# Patient Record
Sex: Female | Born: 1955 | Race: White | Hispanic: No | Marital: Married | Smoking: Former smoker
Health system: Southern US, Community
[De-identification: ages and names within clinical notes are randomized; demographics above are authoritative.]

## PROBLEM LIST (undated history)

## (undated) DIAGNOSIS — K589 Irritable bowel syndrome without diarrhea: Secondary | ICD-10-CM

## (undated) DIAGNOSIS — G8929 Other chronic pain: Secondary | ICD-10-CM

## (undated) DIAGNOSIS — M549 Dorsalgia, unspecified: Secondary | ICD-10-CM

## (undated) DIAGNOSIS — F32A Depression, unspecified: Secondary | ICD-10-CM

## (undated) DIAGNOSIS — M1990 Unspecified osteoarthritis, unspecified site: Secondary | ICD-10-CM

## (undated) DIAGNOSIS — K219 Gastro-esophageal reflux disease without esophagitis: Secondary | ICD-10-CM

## (undated) DIAGNOSIS — L900 Lichen sclerosus et atrophicus: Secondary | ICD-10-CM

## (undated) DIAGNOSIS — F329 Major depressive disorder, single episode, unspecified: Secondary | ICD-10-CM

## (undated) DIAGNOSIS — F419 Anxiety disorder, unspecified: Secondary | ICD-10-CM

## (undated) HISTORY — DX: Irritable bowel syndrome, unspecified: K58.9

## (undated) HISTORY — DX: Unspecified osteoarthritis, unspecified site: M19.90

## (undated) HISTORY — PX: OOPHORECTOMY: SHX86

## (undated) HISTORY — PX: SP BILIARY STONE EXTRACTION: HXRAD329

## (undated) HISTORY — DX: Other chronic pain: G89.29

## (undated) HISTORY — DX: Dorsalgia, unspecified: M54.9

## (undated) HISTORY — DX: Gastro-esophageal reflux disease without esophagitis: K21.9

## (undated) HISTORY — DX: Lichen sclerosus et atrophicus: L90.0

## (undated) HISTORY — PX: ERCP: SHX60

---

## 1997-12-06 HISTORY — PX: BACK SURGERY: SHX140

## 1998-06-02 ENCOUNTER — Ambulatory Visit (HOSPITAL_COMMUNITY): Admission: RE | Admit: 1998-06-02 | Discharge: 1998-06-02 | Payer: Self-pay | Admitting: Neurosurgery

## 1998-07-03 ENCOUNTER — Inpatient Hospital Stay (HOSPITAL_COMMUNITY): Admission: RE | Admit: 1998-07-03 | Discharge: 1998-07-03 | Payer: Self-pay | Admitting: Neurosurgery

## 1998-08-04 ENCOUNTER — Ambulatory Visit (HOSPITAL_COMMUNITY): Admission: RE | Admit: 1998-08-04 | Discharge: 1998-08-04 | Payer: Self-pay | Admitting: Neurosurgery

## 1998-10-01 ENCOUNTER — Ambulatory Visit (HOSPITAL_COMMUNITY): Admission: RE | Admit: 1998-10-01 | Discharge: 1998-10-01 | Payer: Self-pay | Admitting: Neurosurgery

## 1998-10-01 ENCOUNTER — Encounter: Payer: Self-pay | Admitting: Neurosurgery

## 1999-12-07 HISTORY — PX: CHOLECYSTECTOMY: SHX55

## 2002-09-03 ENCOUNTER — Encounter: Payer: Self-pay | Admitting: Neurosurgery

## 2002-09-03 ENCOUNTER — Encounter: Admission: RE | Admit: 2002-09-03 | Discharge: 2002-09-03 | Payer: Self-pay | Admitting: Neurosurgery

## 2004-09-07 ENCOUNTER — Ambulatory Visit: Payer: Self-pay | Admitting: Pain Medicine

## 2004-09-21 ENCOUNTER — Ambulatory Visit: Payer: Self-pay | Admitting: Pain Medicine

## 2004-10-07 ENCOUNTER — Ambulatory Visit: Payer: Self-pay | Admitting: Pain Medicine

## 2004-11-05 ENCOUNTER — Ambulatory Visit: Payer: Self-pay | Admitting: Physician Assistant

## 2004-11-24 ENCOUNTER — Ambulatory Visit: Payer: Self-pay | Admitting: Physician Assistant

## 2004-12-08 ENCOUNTER — Ambulatory Visit: Payer: Self-pay | Admitting: Pain Medicine

## 2005-01-04 ENCOUNTER — Ambulatory Visit: Payer: Self-pay | Admitting: Pain Medicine

## 2005-01-20 ENCOUNTER — Ambulatory Visit: Payer: Self-pay | Admitting: Pain Medicine

## 2005-03-10 ENCOUNTER — Ambulatory Visit: Payer: Self-pay | Admitting: Pain Medicine

## 2005-04-05 ENCOUNTER — Ambulatory Visit: Payer: Self-pay | Admitting: Pain Medicine

## 2005-04-19 ENCOUNTER — Inpatient Hospital Stay: Payer: Self-pay | Admitting: Pain Medicine

## 2005-04-19 ENCOUNTER — Ambulatory Visit: Payer: Self-pay | Admitting: Pain Medicine

## 2005-05-05 ENCOUNTER — Ambulatory Visit: Payer: Self-pay | Admitting: Pain Medicine

## 2005-05-21 ENCOUNTER — Emergency Department: Payer: Self-pay | Admitting: Unknown Physician Specialty

## 2005-06-01 ENCOUNTER — Ambulatory Visit: Payer: Self-pay | Admitting: Physician Assistant

## 2005-06-25 ENCOUNTER — Ambulatory Visit: Payer: Self-pay | Admitting: Physician Assistant

## 2005-07-29 ENCOUNTER — Ambulatory Visit: Payer: Self-pay | Admitting: Physician Assistant

## 2005-08-25 ENCOUNTER — Ambulatory Visit: Payer: Self-pay | Admitting: Physician Assistant

## 2005-09-29 ENCOUNTER — Ambulatory Visit: Payer: Self-pay | Admitting: Physician Assistant

## 2005-10-18 ENCOUNTER — Ambulatory Visit: Payer: Self-pay | Admitting: Physician Assistant

## 2005-10-27 ENCOUNTER — Ambulatory Visit: Payer: Self-pay | Admitting: Physician Assistant

## 2005-11-10 ENCOUNTER — Ambulatory Visit: Payer: Self-pay | Admitting: Physician Assistant

## 2005-12-02 ENCOUNTER — Ambulatory Visit: Payer: Self-pay | Admitting: Internal Medicine

## 2005-12-06 HISTORY — PX: COLONOSCOPY: SHX174

## 2005-12-06 HISTORY — PX: UPPER GI ENDOSCOPY: SHX6162

## 2005-12-16 ENCOUNTER — Ambulatory Visit: Payer: Self-pay | Admitting: Physician Assistant

## 2006-01-26 ENCOUNTER — Ambulatory Visit: Payer: Self-pay | Admitting: Physician Assistant

## 2006-02-03 ENCOUNTER — Ambulatory Visit: Payer: Self-pay | Admitting: Physician Assistant

## 2006-02-17 ENCOUNTER — Ambulatory Visit: Payer: Self-pay | Admitting: Physician Assistant

## 2006-03-23 ENCOUNTER — Ambulatory Visit: Payer: Self-pay | Admitting: Physician Assistant

## 2006-04-26 ENCOUNTER — Ambulatory Visit: Payer: Self-pay | Admitting: Physician Assistant

## 2006-05-23 ENCOUNTER — Ambulatory Visit: Payer: Self-pay | Admitting: Physician Assistant

## 2006-05-27 ENCOUNTER — Ambulatory Visit: Payer: Self-pay | Admitting: Physician Assistant

## 2006-08-22 ENCOUNTER — Ambulatory Visit: Payer: Self-pay | Admitting: Pain Medicine

## 2006-09-26 ENCOUNTER — Ambulatory Visit: Payer: Self-pay | Admitting: Pain Medicine

## 2006-10-10 ENCOUNTER — Ambulatory Visit: Payer: Self-pay | Admitting: Pain Medicine

## 2006-10-19 ENCOUNTER — Ambulatory Visit: Payer: Self-pay | Admitting: Pain Medicine

## 2006-11-21 ENCOUNTER — Ambulatory Visit: Payer: Self-pay | Admitting: Pain Medicine

## 2006-12-05 ENCOUNTER — Ambulatory Visit: Payer: Self-pay | Admitting: Internal Medicine

## 2006-12-07 ENCOUNTER — Ambulatory Visit: Payer: Self-pay | Admitting: Pain Medicine

## 2006-12-07 ENCOUNTER — Other Ambulatory Visit: Payer: Self-pay

## 2006-12-20 ENCOUNTER — Encounter: Admission: RE | Admit: 2006-12-20 | Discharge: 2006-12-20 | Payer: Self-pay | Admitting: Neurosurgery

## 2006-12-29 ENCOUNTER — Ambulatory Visit: Payer: Self-pay | Admitting: Physician Assistant

## 2007-01-07 ENCOUNTER — Encounter: Admission: RE | Admit: 2007-01-07 | Discharge: 2007-01-07 | Payer: Self-pay | Admitting: Neurosurgery

## 2007-01-30 ENCOUNTER — Ambulatory Visit: Payer: Self-pay | Admitting: Pain Medicine

## 2007-02-06 ENCOUNTER — Ambulatory Visit: Payer: Self-pay | Admitting: Physician Assistant

## 2007-02-23 ENCOUNTER — Ambulatory Visit: Payer: Self-pay | Admitting: Physician Assistant

## 2007-03-07 ENCOUNTER — Ambulatory Visit: Payer: Self-pay | Admitting: Pain Medicine

## 2007-03-09 ENCOUNTER — Ambulatory Visit: Payer: Self-pay | Admitting: Pain Medicine

## 2007-03-16 ENCOUNTER — Ambulatory Visit: Payer: Self-pay | Admitting: Pain Medicine

## 2007-04-03 ENCOUNTER — Ambulatory Visit: Payer: Self-pay | Admitting: Pain Medicine

## 2007-04-17 ENCOUNTER — Ambulatory Visit: Payer: Self-pay | Admitting: Pain Medicine

## 2007-05-05 ENCOUNTER — Ambulatory Visit: Payer: Self-pay | Admitting: Pain Medicine

## 2007-05-10 ENCOUNTER — Ambulatory Visit: Payer: Self-pay | Admitting: Pain Medicine

## 2007-05-15 ENCOUNTER — Ambulatory Visit: Payer: Self-pay | Admitting: Pain Medicine

## 2007-05-18 ENCOUNTER — Ambulatory Visit: Payer: Self-pay | Admitting: Physician Assistant

## 2007-06-05 ENCOUNTER — Ambulatory Visit: Payer: Self-pay | Admitting: Pain Medicine

## 2007-06-28 ENCOUNTER — Ambulatory Visit: Payer: Self-pay | Admitting: Pain Medicine

## 2007-08-28 ENCOUNTER — Ambulatory Visit: Payer: Self-pay | Admitting: Pain Medicine

## 2007-09-25 ENCOUNTER — Ambulatory Visit: Payer: Self-pay | Admitting: Pain Medicine

## 2007-10-10 ENCOUNTER — Ambulatory Visit: Payer: Self-pay | Admitting: Pain Medicine

## 2007-10-19 ENCOUNTER — Ambulatory Visit: Payer: Self-pay | Admitting: Physician Assistant

## 2007-11-27 ENCOUNTER — Ambulatory Visit: Payer: Self-pay | Admitting: Physician Assistant

## 2007-12-18 ENCOUNTER — Ambulatory Visit: Payer: Self-pay | Admitting: Physician Assistant

## 2008-01-03 ENCOUNTER — Ambulatory Visit: Payer: Self-pay | Admitting: Pain Medicine

## 2008-02-16 ENCOUNTER — Ambulatory Visit: Payer: Self-pay | Admitting: Pain Medicine

## 2008-04-24 ENCOUNTER — Ambulatory Visit: Payer: Self-pay | Admitting: Internal Medicine

## 2008-04-24 ENCOUNTER — Ambulatory Visit: Payer: Self-pay | Admitting: Pain Medicine

## 2008-08-15 ENCOUNTER — Ambulatory Visit: Payer: Self-pay | Admitting: Physician Assistant

## 2008-08-20 ENCOUNTER — Ambulatory Visit: Payer: Self-pay | Admitting: Physician Assistant

## 2008-11-07 ENCOUNTER — Ambulatory Visit: Payer: Self-pay | Admitting: Physician Assistant

## 2008-11-26 ENCOUNTER — Ambulatory Visit: Payer: Self-pay | Admitting: Physician Assistant

## 2008-12-06 DIAGNOSIS — G8929 Other chronic pain: Secondary | ICD-10-CM

## 2008-12-06 HISTORY — DX: Other chronic pain: G89.29

## 2008-12-17 ENCOUNTER — Ambulatory Visit: Payer: Self-pay | Admitting: Physician Assistant

## 2009-01-22 ENCOUNTER — Ambulatory Visit: Payer: Self-pay | Admitting: Pain Medicine

## 2009-01-31 ENCOUNTER — Ambulatory Visit: Payer: Self-pay | Admitting: Pain Medicine

## 2009-03-11 ENCOUNTER — Ambulatory Visit: Payer: Self-pay | Admitting: Physician Assistant

## 2009-03-25 ENCOUNTER — Ambulatory Visit: Payer: Self-pay | Admitting: Pain Medicine

## 2009-04-09 ENCOUNTER — Ambulatory Visit: Payer: Self-pay | Admitting: Physician Assistant

## 2009-05-07 ENCOUNTER — Ambulatory Visit: Payer: Self-pay | Admitting: Physician Assistant

## 2009-07-10 ENCOUNTER — Ambulatory Visit: Payer: Self-pay | Admitting: Physician Assistant

## 2009-10-08 ENCOUNTER — Ambulatory Visit: Payer: Self-pay | Admitting: Physician Assistant

## 2009-10-13 ENCOUNTER — Ambulatory Visit: Payer: Self-pay | Admitting: Pain Medicine

## 2009-10-17 ENCOUNTER — Ambulatory Visit: Payer: Self-pay | Admitting: Pain Medicine

## 2009-10-27 ENCOUNTER — Ambulatory Visit: Payer: Self-pay | Admitting: Pain Medicine

## 2009-12-02 ENCOUNTER — Ambulatory Visit: Payer: Self-pay | Admitting: Specialist

## 2009-12-10 ENCOUNTER — Ambulatory Visit: Payer: Self-pay | Admitting: Specialist

## 2010-02-02 ENCOUNTER — Ambulatory Visit: Payer: Self-pay | Admitting: Pain Medicine

## 2010-05-25 ENCOUNTER — Ambulatory Visit: Payer: Self-pay | Admitting: Pain Medicine

## 2010-12-06 HISTORY — PX: BILIARY DRAINAGE: SHX1229

## 2010-12-06 HISTORY — PX: ABDOMINAL HYSTERECTOMY: SHX81

## 2012-12-06 HISTORY — PX: ULNAR NERVE REPAIR: SHX2594

## 2014-05-08 ENCOUNTER — Other Ambulatory Visit: Payer: Self-pay | Admitting: *Deleted

## 2014-05-08 DIAGNOSIS — G8929 Other chronic pain: Secondary | ICD-10-CM

## 2014-05-08 DIAGNOSIS — M546 Pain in thoracic spine: Principal | ICD-10-CM

## 2014-05-08 DIAGNOSIS — M5416 Radiculopathy, lumbar region: Secondary | ICD-10-CM

## 2014-05-19 ENCOUNTER — Other Ambulatory Visit: Payer: Self-pay

## 2014-05-29 ENCOUNTER — Ambulatory Visit
Admission: RE | Admit: 2014-05-29 | Discharge: 2014-05-29 | Disposition: A | Discharge status: Home / Self Care | Payer: Medicare Other | Source: Ambulatory Visit | Attending: *Deleted | Admitting: *Deleted

## 2014-05-29 DIAGNOSIS — G8929 Other chronic pain: Secondary | ICD-10-CM

## 2014-05-29 DIAGNOSIS — M5416 Radiculopathy, lumbar region: Principal | ICD-10-CM

## 2014-05-29 DIAGNOSIS — M546 Pain in thoracic spine: Principal | ICD-10-CM

## 2014-05-29 MED ORDER — GADOBENATE DIMEGLUMINE 529 MG/ML IV SOLN
18.0000 mL | Freq: Once | INTRAVENOUS | Status: AC | PRN
  Administered 2014-05-29: 18 mL via INTRAVENOUS

## 2015-04-02 ENCOUNTER — Other Ambulatory Visit: Payer: Self-pay | Admitting: Internal Medicine

## 2015-04-02 DIAGNOSIS — N644 Mastodynia: Secondary | ICD-10-CM

## 2015-04-10 ENCOUNTER — Ambulatory Visit
Admission: RE | Admit: 2015-04-10 | Discharge: 2015-04-10 | Disposition: A | Discharge status: Home / Self Care | Payer: Medicare Other | Source: Ambulatory Visit | Attending: Internal Medicine | Admitting: Internal Medicine

## 2015-04-10 DIAGNOSIS — N644 Mastodynia: Secondary | ICD-10-CM

## 2016-02-25 ENCOUNTER — Other Ambulatory Visit: Payer: Self-pay | Admitting: *Deleted

## 2016-02-25 MED ORDER — MONTELUKAST SODIUM 10 MG PO TABS: 10.0000 mg | ORAL_TABLET | Freq: Every day | ORAL | Status: DC

## 2016-04-22 ENCOUNTER — Other Ambulatory Visit: Payer: Self-pay | Admitting: *Deleted

## 2016-04-22 MED ORDER — MONTELUKAST SODIUM 10 MG PO TABS: 10.0000 mg | ORAL_TABLET | Freq: Every day | ORAL | Status: DC

## 2016-04-30 ENCOUNTER — Other Ambulatory Visit: Payer: Self-pay | Admitting: Internal Medicine

## 2016-04-30 DIAGNOSIS — Z1231 Encounter for screening mammogram for malignant neoplasm of breast: Secondary | ICD-10-CM

## 2016-05-13 ENCOUNTER — Ambulatory Visit: Payer: Medicare Other

## 2016-05-20 ENCOUNTER — Ambulatory Visit
Admission: RE | Admit: 2016-05-20 | Discharge: 2016-05-20 | Disposition: A | Discharge status: Home / Self Care | Payer: Medicare Other | Source: Ambulatory Visit | Attending: Internal Medicine | Admitting: Internal Medicine

## 2016-05-20 ENCOUNTER — Other Ambulatory Visit: Payer: Self-pay | Admitting: Internal Medicine

## 2016-05-20 DIAGNOSIS — Z1231 Encounter for screening mammogram for malignant neoplasm of breast: Secondary | ICD-10-CM | POA: Diagnosis not present

## 2016-06-05 IMAGING — MG MM DIAG BREAST TOMO BILATERAL
8 of 20 series · 8 of 40 positions shown · non-contrast
Comparison: 04/24/2008

CLINICAL DATA: Focal pain right breast, 9 o'clock region
periareolar. Due for annual examination.

EXAM:
DIGITAL DIAGNOSTIC BILATERAL MAMMOGRAM WITH 3D TOMOSYNTHESIS WITH
CAD
ULTRASOUND RIGHT BREAST

[L MLO (1 of 2)]
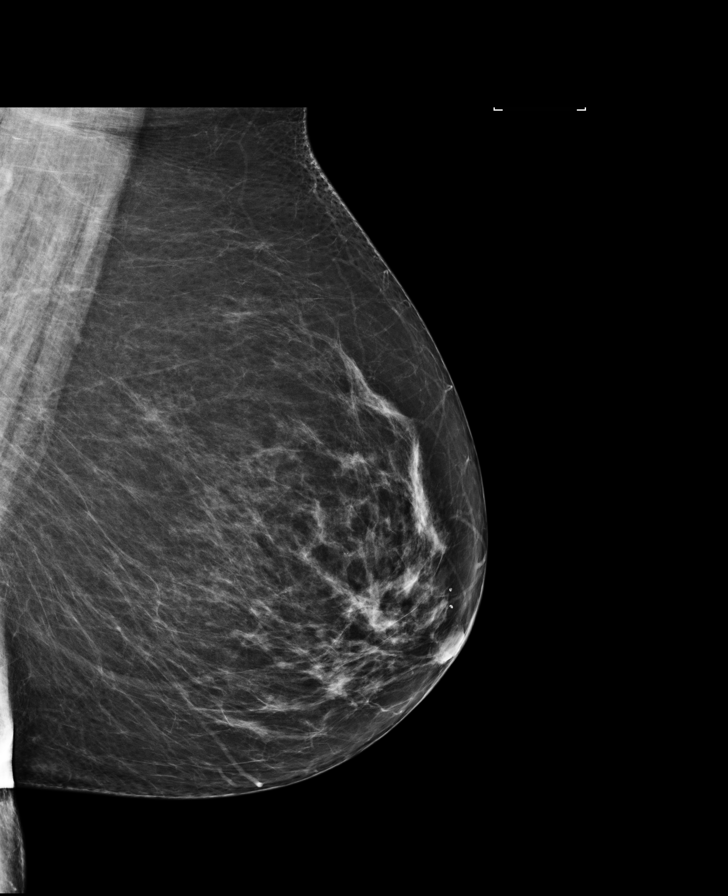

[L MLO synth-2D (1 of 2)]
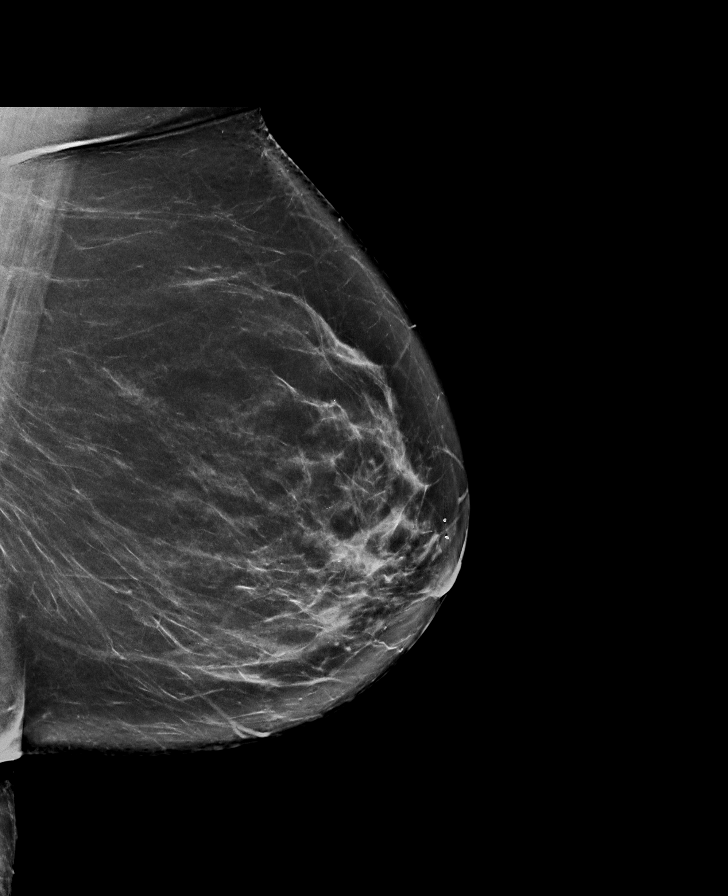

[L MLO synth-2D (2 of 2)]
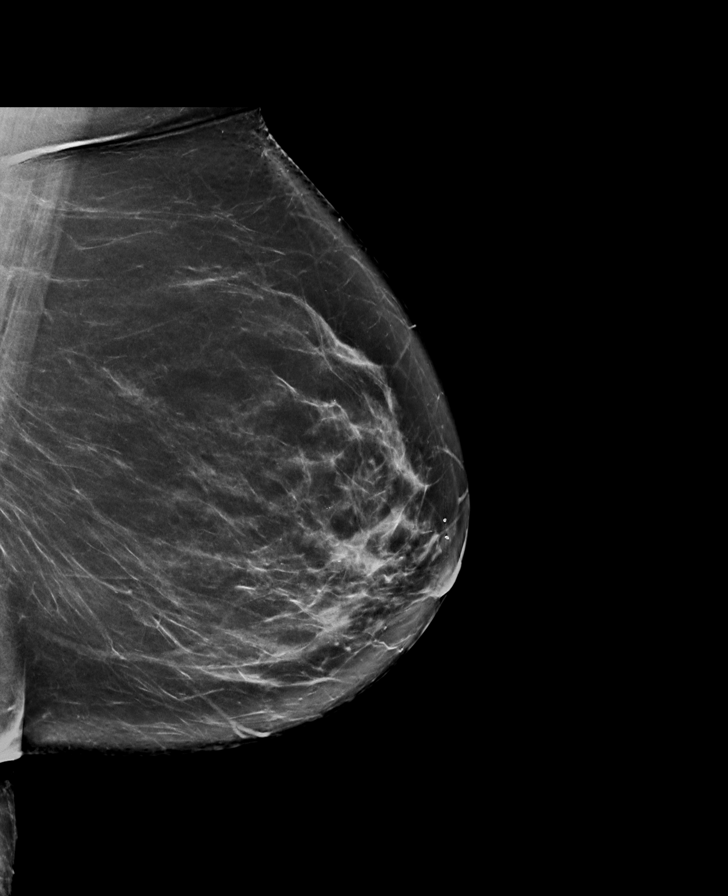

[R MLO synth-2D]
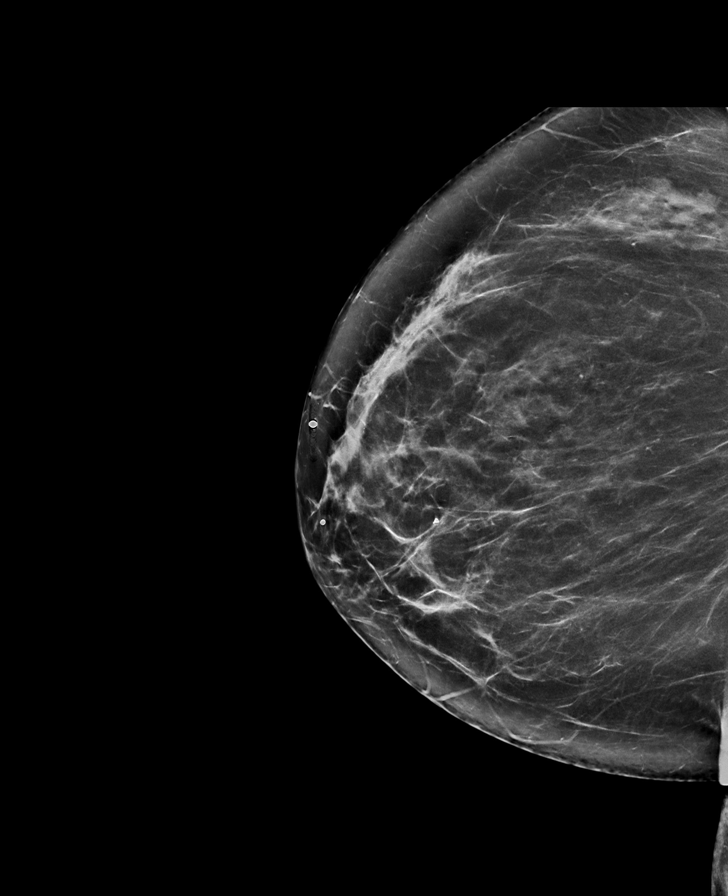

[L MLO (2 of 2)]
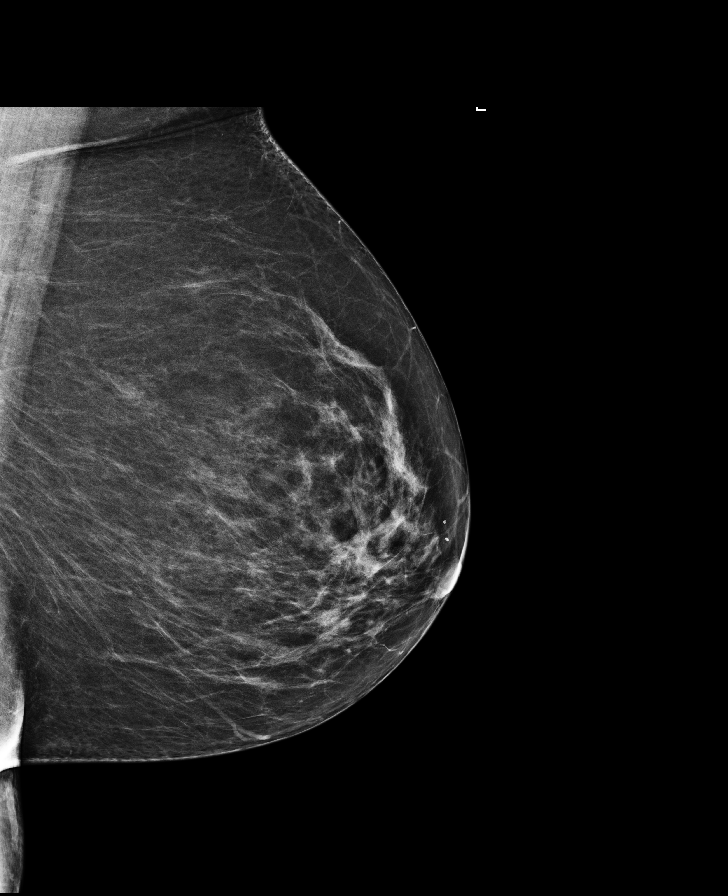

[L CC]
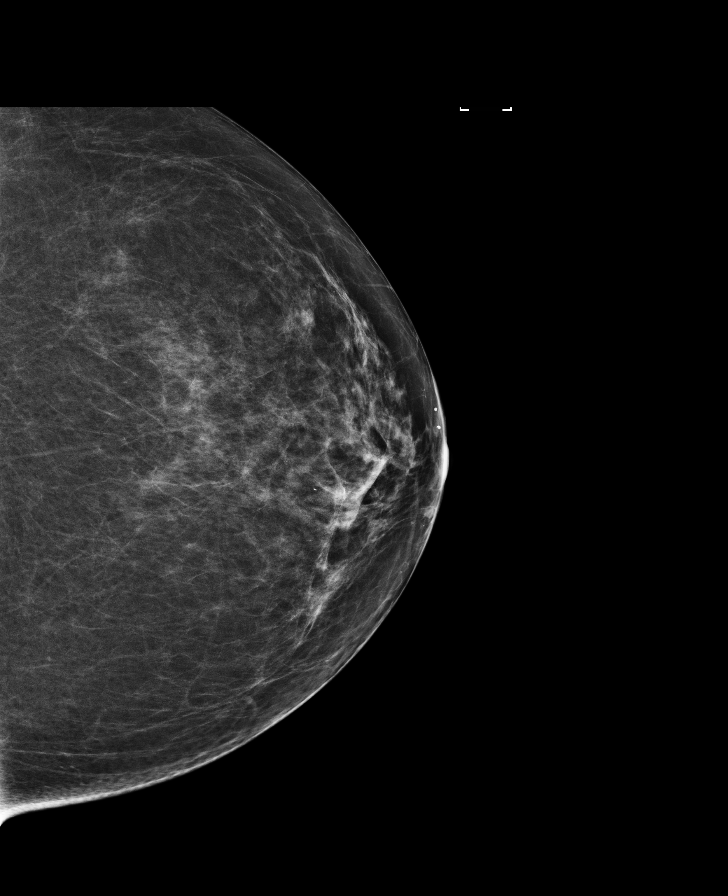

[R MLO (1 of 2)]
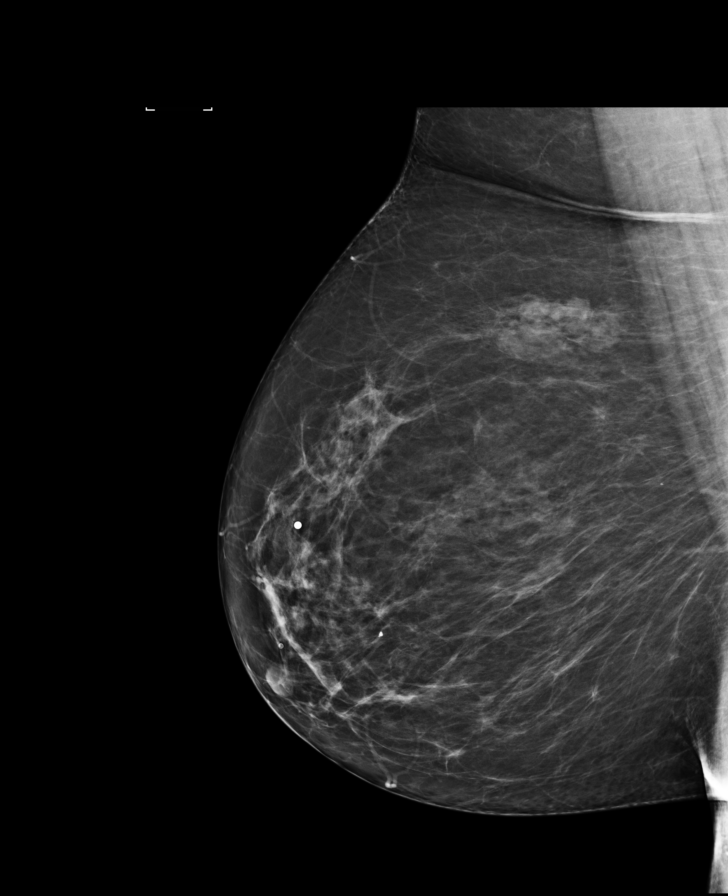

[R MLO (2 of 2)]
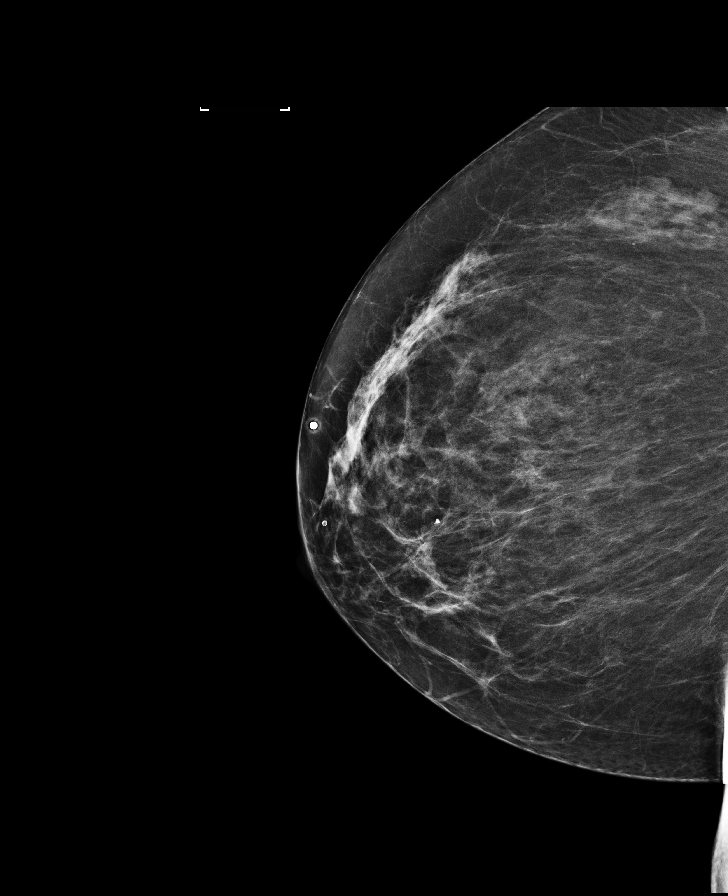

[8 of 40 positions shown; findings below may reference images not displayed]

ACR Breast Density Category b: There are scattered areas of
fibroglandular density.
FINDINGS: Metallic skin marker was placed at the site of focal pain. No mass,
distortion, or suspicious microcalcification is identified in either
breast to suggest malignancy. Spot tangential view of the region of
patient concern in the outer right breast is negative.

Mammographic images were processed with CAD.

On physical exam, I do not palpate a mass or thickening in in the
outer right breast.

Targeted ultrasound is performed, showing normal fibroglandular
breast tissue in the region of patient concern.
IMPRESSION: No evidence of malignancy in either breast.

RECOMMENDATION:
Screening mammogram in one year.(Code:[id number])

I have discussed the findings and recommendations with the patient.
Results were also provided in writing at the conclusion of the
visit. If applicable, a reminder letter will be sent to the patient
regarding the next appointment.

BI-RADS CATEGORY  1: Negative.

## 2016-12-06 DIAGNOSIS — L900 Lichen sclerosus et atrophicus: Secondary | ICD-10-CM

## 2016-12-06 HISTORY — DX: Lichen sclerosus et atrophicus: L90.0

## 2018-03-09 ENCOUNTER — Encounter: Payer: Self-pay | Admitting: *Deleted

## 2018-04-11 ENCOUNTER — Encounter: Payer: Self-pay | Admitting: General Surgery

## 2018-04-11 ENCOUNTER — Ambulatory Visit: Payer: Medicare Other | Admitting: General Surgery

## 2018-04-11 VITALS — BP 130/68 | HR 72 | Resp 14 | Ht 68.0 in | Wt 214.0 lb

## 2018-04-11 DIAGNOSIS — Z1211 Encounter for screening for malignant neoplasm of colon: Secondary | ICD-10-CM | POA: Diagnosis not present

## 2018-04-11 MED ORDER — POLYETHYLENE GLYCOL 3350 17 GM/SCOOP PO POWD: 1.0000 | Freq: Once | ORAL | 0 refills | Status: AC

## 2018-04-11 MED ORDER — METOCLOPRAMIDE HCL 10 MG PO TABS: 10.0000 mg | ORAL_TABLET | ORAL | 0 refills | Status: DC

## 2018-04-11 NOTE — Patient Instructions (Addendum)
Colonoscopy, Adult A colonoscopy is an exam to look at the entire large intestine. During the exam, a lubricated, bendable tube is inserted into the anus and then passed into the rectum, colon, and other parts of the large intestine. A colonoscopy is often done as a part of normal colorectal screening or in response to certain symptoms, such as anemia, persistent diarrhea, abdominal pain, and blood in the stool. The exam can help screen for and diagnose medical problems, including:  Tumors.  Polyps.  Inflammation.  Areas of bleeding.  Tell a health care provider about:  Any allergies you have.  All medicines you are taking, including vitamins, herbs, eye drops, creams, and over-the-counter medicines.  Any problems you or family members have had with anesthetic medicines.  Any blood disorders you have.  Any surgeries you have had.  Any medical conditions you have.  Any problems you have had passing stool. What are the risks? Generally, this is a safe procedure. However, problems may occur, including:  Bleeding.  A tear in the intestine.  A reaction to medicines given during the exam.  Infection (rare).  What happens before the procedure? Eating and drinking restrictions Follow instructions from your health care provider about eating and drinking, which may include:  A few days before the procedure - follow a low-fiber diet. Avoid nuts, seeds, dried fruit, raw fruits, and vegetables.  1-3 days before the procedure - follow a clear liquid diet. Drink only clear liquids, such as clear broth or bouillon, black coffee or tea, clear juice, clear soft drinks or sports drinks, gelatin dessert, and popsicles. Avoid any liquids that contain red or purple dye.  On the day of the procedure - do not eat or drink anything during the 2 hours before the procedure, or within the time period that your health care provider recommends.  Bowel prep If you were prescribed an oral bowel prep  to clean out your colon:  Take it as told by your health care provider. Starting the day before your procedure, you will need to drink a large amount of medicated liquid. The liquid will cause you to have multiple loose stools until your stool is almost clear or light green.  If your skin or anus gets irritated from diarrhea, you may use these to relieve the irritation: ? Medicated wipes, such as adult wet wipes with aloe and vitamin E. ? A skin soothing-product like petroleum jelly.  If you vomit while drinking the bowel prep, take a break for up to 60 minutes and then begin the bowel prep again. If vomiting continues and you cannot take the bowel prep without vomiting, call your health care provider.  General instructions  Ask your health care provider about changing or stopping your regular medicines. This is especially important if you are taking diabetes medicines or blood thinners.  Plan to have someone take you home from the hospital or clinic. What happens during the procedure?  An IV tube may be inserted into one of your veins.  You will be given medicine to help you relax (sedative).  To reduce your risk of infection: ? Your health care team will wash or sanitize their hands. ? Your anal area will be washed with soap.  You will be asked to lie on your side with your knees bent.  Your health care provider will lubricate a long, thin, flexible tube. The tube will have a camera and a light on the end.  The tube will be inserted into your   anus.  The tube will be gently eased through your rectum and colon.  Air will be delivered into your colon to keep it open. You may feel some pressure or cramping.  The camera will be used to take images during the procedure.  A small tissue sample may be removed from your body to be examined under a microscope (biopsy). If any potential problems are found, the tissue will be sent to a lab for testing.  If small polyps are found, your  health care provider may remove them and have them checked for cancer cells.  The tube that was inserted into your anus will be slowly removed. The procedure may vary among health care providers and hospitals. What happens after the procedure?  Your blood pressure, heart rate, breathing rate, and blood oxygen level will be monitored until the medicines you were given have worn off.  Do not drive for 24 hours after the exam.  You may have a small amount of blood in your stool.  You may pass gas and have mild abdominal cramping or bloating due to the air that was used to inflate your colon during the exam.  It is up to you to get the results of your procedure. Ask your health care provider, or the department performing the procedure, when your results will be ready. This information is not intended to replace advice given to you by your health care provider. Make sure you discuss any questions you have with your health care provider. Document Released: 11/19/2000 Document Revised: 09/22/2016 Document Reviewed: 02/03/2016 Elsevier Interactive Patient Education  [employer].  The patient is scheduled for a Colonoscopy at [facility] on 04/26/18. They are aware to call the day before to get their arrival time. She will only take her Metoprolol at 6 am with a sip of water the morning of and also will take her Reglan 30 minutes prior to procedure. Miralax prescription has been sent into the patient's pharmacy. The patient is aware of date and instructions.

## 2018-04-11 NOTE — Progress Notes (Signed)
Patient ID: Carol Orr, female   DOB: Jul 13, 1956, 63 y.o.   MRN: [id number]  Chief Complaint  Patient presents with  . Colonoscopy    HPI Carol Orr is a 62 y.o. female here today for a evaluation of a colonoscopy. Patient states her last colonoscopy was done in 2013. Patient states no GI problems at this time. Moves her bowels daily. She does state grease bothers her stomach.  HPI  Past Medical History:  Diagnosis Date  . Arthritis   . Chronic back pain 2010   with pain pump  . GERD (gastroesophageal reflux disease)   . IBS (irritable bowel syndrome)   . Lichen sclerosus 2018   treated with methotrexate     Past Surgical History:  Procedure Laterality Date  . ABDOMINAL HYSTERECTOMY  2012  . BACK SURGERY  1999  . BILIARY DRAINAGE  2012   [facility]  . CHOLECYSTECTOMY  2001  . COLONOSCOPY  2007  . ERCP    . SP BILIARY STONE EXTRACTION    . ULNAR NERVE REPAIR Left 2014   Dr Hyacinth Meeker  . UPPER GI ENDOSCOPY  2007    Family History  Problem Relation Age of Onset  . Breast cancer Mother   . Colon cancer Mother   . Ovarian cancer Sister   . Liver disease Father     Social History Social History   Tobacco Use  . Smoking status: Former Smoker    Types: Cigarettes    Last attempt to quit: 12/06/2013    Years since quitting: 4.3  . Smokeless tobacco: Never Used  Substance Use Topics  . Alcohol use: Never    Frequency: Never  . Drug use: Never    Allergies  Allergen Reactions  . Ciprofloxacin Rash  . Penicillins Rash  . Tape Rash    Current Outpatient Medications  Medication Sig Dispense Refill  . busPIRone (BUSPAR) 15 MG tablet TK 1/2 TO 1 T PO BID  5  . Cholecalciferol (VITAMIN D3) 2000 units capsule Take 2,000 Units by mouth daily.     . cyclobenzaprine (FLEXERIL) 5 MG tablet Take 5 mg by mouth 3 (three) times daily as needed.     Marland Kitchen estradiol (ESTRACE) 0.5 MG tablet Take 0.5 mg by mouth daily.     Marland Kitchen gabapentin (NEURONTIN) 800 MG tablet Take 800  mg by mouth 4 (four) times daily.     . metoprolol tartrate (LOPRESSOR) 25 MG tablet TAKE ONE TABLET BY MOUTH DAILY    . montelukast (SINGULAIR) 10 MG tablet Take 1 tablet (10 mg total) by mouth daily. 30 tablet 0  . oxyCODONE-acetaminophen (PERCOCET) 10-325 MG tablet Take 1 tablet by mouth every 6 (six) hours as needed.   0  . simvastatin (ZOCOR) 40 MG tablet Take 40 mg by mouth daily at 6 PM.     . traMADol (ULTRAM) 50 MG tablet TK 1 T PO Q 6 HOURS PRN FOR PAIN  2  . venlafaxine XR (EFFEXOR-XR) 150 MG 24 hr capsule Take 150 mg by mouth daily.     . clobetasol cream (TEMOVATE) 0.05 % Apply 1 application topically 2 (two) times daily.    . ISOtretinoin (CLARAVIS) 40 MG capsule TK 1 C PO QD WITH FOOD    . metoCLOPramide (REGLAN) 10 MG tablet Take 1 tablet (10 mg total) by mouth as directed for 2 doses. Take one tablet 30 minutes prior to prep may repeat in 4 hours if needed 2 tablet 0  . pimecrolimus (ELIDEL)  1 % cream Apply topically 2 (two) times daily.    Marland Kitchen triamcinolone cream (KENALOG) 0.1 % APPLY TO THE AFFECTED AREA 2-3 TIMES A DAY AS NEEDED     No current facility-administered medications for this visit.     Review of Systems Review of Systems  Constitutional: Negative.   Respiratory: Negative.   Cardiovascular: Negative.     Blood pressure 130/68, pulse 72, resp. rate 14, height  (1.727 m), weight 214 lb (97.1 kg).  Physical Exam Physical Exam  Constitutional: She is oriented to person, place, and time. She appears well-developed and well-nourished.  HENT:  Head: Normocephalic.  Eyes: No scleral icterus.  Neck: Normal range of motion. Neck supple.  Cardiovascular: Normal rate, regular rhythm and normal heart sounds.  Pulmonary/Chest: Effort normal and breath sounds normal.  Neurological: She is alert and oriented to person, place, and time.  Skin: Skin is warm and dry.  Psychiatric: Her behavior is normal.    Data Reviewed Laboratory studies dated January 26, 2018  showed a potassium of 5.1, otherwise normal electrolytes.  Creatinine 1.0 with an estimated GFR of 56.  Normal liver function studies.  Hemoglobin A1c 5.9.  Colonoscopy pathology of June 27, 2006 showed a hyperplastic polyp in the sigmoid.  Colonoscopy report described a 6 mm sessile polyp.  Upper endoscopy of the same date showed mild gastritis.  Biopsies were benign.  Assessment    Candidate for screening colonoscopy.    Plan    Reglan prior to colonoscopy prep Colonoscopy with possible biopsy/polypectomy prn: Information regarding the procedure, including its potential risks and complications (including but not limited to perforation of the bowel, which may require emergency surgery to repair, and bleeding) was verbally given to the patient. Educational information regarding lower intestinal endoscopy was given to the patient. Written instructions for how to complete the bowel prep using Miralax were provided. The importance of drinking ample fluids to avoid dehydration as a result of the prep emphasized.     HPI, Physical Exam, Assessment and Plan have been scribed under the direction and in the presence of Earline Mayotte, MD. Dorathy Daft, RN  The patient is scheduled for a Colonoscopy at [facility] on 04/26/18. They are aware to call the day before to get their arrival time. She will only take her Metoprolol at 6 am with a sip of water the morning of and also will take her Reglan 30 minutes prior to procedure. Miralax prescription has been sent into the patient's pharmacy. The patient is aware of date and instructions.  Merrily Pew Gaila Engebretsen 04/12/2018, 5:25 PM

## 2018-04-12 DIAGNOSIS — Z1211 Encounter for screening for malignant neoplasm of colon: Secondary | ICD-10-CM | POA: Insufficient documentation

## 2018-04-26 ENCOUNTER — Ambulatory Visit: Payer: Medicare Other | Admitting: Registered Nurse

## 2018-04-26 ENCOUNTER — Encounter
Admission: RE | Disposition: A | Discharge status: Home / Self Care | Payer: Self-pay | Source: Ambulatory Visit | Attending: General Surgery

## 2018-04-26 ENCOUNTER — Ambulatory Visit
Admission: RE | Admit: 2018-04-26 | Discharge: 2018-04-26 | Disposition: A | Discharge status: Home / Self Care | Payer: Medicare Other | Source: Ambulatory Visit | Attending: General Surgery | Admitting: General Surgery

## 2018-04-26 ENCOUNTER — Other Ambulatory Visit: Payer: Self-pay

## 2018-04-26 DIAGNOSIS — K219 Gastro-esophageal reflux disease without esophagitis: Secondary | ICD-10-CM | POA: Diagnosis not present

## 2018-04-26 DIAGNOSIS — Z1211 Encounter for screening for malignant neoplasm of colon: Secondary | ICD-10-CM

## 2018-04-26 DIAGNOSIS — F419 Anxiety disorder, unspecified: Secondary | ICD-10-CM | POA: Insufficient documentation

## 2018-04-26 DIAGNOSIS — Z800 Family history of malignant neoplasm of digestive organs: Secondary | ICD-10-CM | POA: Insufficient documentation

## 2018-04-26 DIAGNOSIS — Z79899 Other long term (current) drug therapy: Secondary | ICD-10-CM | POA: Diagnosis not present

## 2018-04-26 DIAGNOSIS — F329 Major depressive disorder, single episode, unspecified: Secondary | ICD-10-CM | POA: Diagnosis not present

## 2018-04-26 DIAGNOSIS — I10 Essential (primary) hypertension: Secondary | ICD-10-CM | POA: Insufficient documentation

## 2018-04-26 HISTORY — DX: Major depressive disorder, single episode, unspecified: F32.9

## 2018-04-26 HISTORY — PX: COLONOSCOPY WITH PROPOFOL: SHX5780

## 2018-04-26 HISTORY — DX: Depression, unspecified: F32.A

## 2018-04-26 HISTORY — DX: Anxiety disorder, unspecified: F41.9

## 2018-04-26 SURGERY — COLONOSCOPY WITH PROPOFOL
Anesthesia: General

## 2018-04-26 MED ORDER — LIDOCAINE HCL (CARDIAC) PF 100 MG/5ML IV SOSY
PREFILLED_SYRINGE | INTRAVENOUS | Status: DC | PRN
  Administered 2018-04-26: 50 mg via INTRAVENOUS

## 2018-04-26 MED ORDER — LIDOCAINE HCL (PF) 2 % IJ SOLN
INTRAMUSCULAR | Status: AC
  Filled 2018-04-26: qty 10

## 2018-04-26 MED ORDER — EPHEDRINE SULFATE 50 MG/ML IJ SOLN
INTRAMUSCULAR | Status: DC | PRN
  Administered 2018-04-26: 10 mg via INTRAVENOUS

## 2018-04-26 MED ORDER — PHENYLEPHRINE HCL 10 MG/ML IJ SOLN
INTRAMUSCULAR | Status: DC | PRN
  Administered 2018-04-26: 100 ug via INTRAVENOUS

## 2018-04-26 MED ORDER — MIDAZOLAM HCL 2 MG/2ML IJ SOLN
INTRAMUSCULAR | Status: DC | PRN
  Administered 2018-04-26: 2 mg via INTRAVENOUS

## 2018-04-26 MED ORDER — PROPOFOL 500 MG/50ML IV EMUL
INTRAVENOUS | Status: AC
  Filled 2018-04-26: qty 50

## 2018-04-26 MED ORDER — MIDAZOLAM HCL 2 MG/2ML IJ SOLN
INTRAMUSCULAR | Status: AC
  Filled 2018-04-26: qty 2

## 2018-04-26 MED ORDER — PROPOFOL 10 MG/ML IV BOLUS
INTRAVENOUS | Status: AC
  Filled 2018-04-26: qty 20

## 2018-04-26 MED ORDER — SODIUM CHLORIDE 0.9 % IV SOLN
INTRAVENOUS | Status: DC
  Administered 2018-04-26: 09:00:00 via INTRAVENOUS

## 2018-04-26 MED ORDER — PROPOFOL 500 MG/50ML IV EMUL
INTRAVENOUS | Status: DC | PRN
  Administered 2018-04-26: 175 ug/kg/min via INTRAVENOUS

## 2018-04-26 NOTE — H&P (Signed)
Carol Orr [id number] [birth year]     HPI:  Family history of colon cancer in her mother. Tolerated prep well. Reglan pre prep seems to have helped.   Medications Prior to Admission  Medication Sig Dispense Refill Last Dose  . busPIRone (BUSPAR) 15 MG tablet TK 1/2 TO 1 T PO BID  5 04/25/2018 at Unknown time  . Cholecalciferol (VITAMIN D3) 2000 units capsule Take 2,000 Units by mouth daily.    04/25/2018 at Unknown time  . clobetasol cream (TEMOVATE) 0.05 % Apply 1 application topically 2 (two) times daily.   Not Taking  . cyclobenzaprine (FLEXERIL) 5 MG tablet Take 5 mg by mouth 3 (three) times daily as needed.    04/25/2018 at Unknown time  . estradiol (ESTRACE) 0.5 MG tablet Take 0.5 mg by mouth daily.    04/25/2018 at Unknown time  . gabapentin (NEURONTIN) 800 MG tablet Take 800 mg by mouth 4 (four) times daily.    04/25/2018 at Unknown time  . metoCLOPramide (REGLAN) 10 MG tablet Take 1 tablet (10 mg total) by mouth as directed for 2 doses. Take one tablet 30 minutes prior to prep may repeat in 4 hours if needed 2 tablet 0 04/25/2018 at Unknown time  . metoprolol tartrate (LOPRESSOR) 25 MG tablet TAKE ONE TABLET BY MOUTH DAILY   04/26/2018 at Unknown time  . montelukast (SINGULAIR) 10 MG tablet Take 1 tablet (10 mg total) by mouth daily. 30 tablet 0 04/25/2018 at Unknown time  . oxyCODONE-acetaminophen (PERCOCET) 10-325 MG tablet Take 1 tablet by mouth every 6 (six) hours as needed.   0 Past Week at Unknown time  . simvastatin (ZOCOR) 40 MG tablet Take 40 mg by mouth daily at 6 PM.    04/25/2018 at Unknown time  . traMADol (ULTRAM) 50 MG tablet TK 1 T PO Q 6 HOURS PRN FOR PAIN  2 04/25/2018 at Unknown time  . venlafaxine XR (EFFEXOR-XR) 150 MG 24 hr capsule Take 150 mg by mouth daily.    04/25/2018 at Unknown time  . ISOtretinoin (CLARAVIS) 40 MG capsule TK 1 C PO QD WITH FOOD   Not Taking at Unknown time  . pimecrolimus (ELIDEL) 1 % cream Apply topically 2 (two) times daily.   Not Taking at  Unknown time  . triamcinolone cream (KENALOG) 0.1 % APPLY TO THE AFFECTED AREA 2-3 TIMES A DAY AS NEEDED   Not Taking at Unknown time   Allergies  Allergen Reactions  . Ciprofloxacin Rash  . Penicillins Rash  . Tape Rash    Plastic tape causes a rash. Paper tape & opsite ok to use   Past Medical History:  Diagnosis Date  . Anxiety   . Arthritis   . Chronic back pain 2010   with pain pump  . Depression   . GERD (gastroesophageal reflux disease)   . IBS (irritable bowel syndrome)   . Lichen sclerosus 2018   treated with methotrexate    Past Surgical History:  Procedure Laterality Date  . ABDOMINAL HYSTERECTOMY  2012  . BACK SURGERY  1999  . BILIARY DRAINAGE  2012   [facility]  . CHOLECYSTECTOMY  2001  . COLONOSCOPY  2007  . ERCP    . SP BILIARY STONE EXTRACTION    . ULNAR NERVE REPAIR Left 2014   Dr Hyacinth Meeker  . UPPER GI ENDOSCOPY  2007   Social History   Socioeconomic History  . Marital status: Married    Spouse name: Not on file  .  Number of children: Not on file  . Years of education: Not on file  . Highest education level: Not on file  Occupational History  . Not on file  Social Needs  . Financial resource strain: Not on file  . Food insecurity:    Worry: Not on file    Inability: Not on file  . Transportation needs:    Medical: Not on file    Non-medical: Not on file  Tobacco Use  . Smoking status: Former Smoker    Types: Cigarettes    Last attempt to quit: 12/06/2013    Years since quitting: 4.3  . Smokeless tobacco: Never Used  Substance and Sexual Activity  . Alcohol use: Never    Frequency: Never  . Drug use: Never  . Sexual activity: Not on file  Lifestyle  . Physical activity:    Days per week: Not on file    Minutes per session: Not on file  . Stress: Not on file  Relationships  . Social connections:    Talks on phone: Not on file    Gets together: Not on file    Attends religious service: Not on file    Active member of club or  organization: Not on file    Attends meetings of clubs or organizations: Not on file    Relationship status: Not on file  . Intimate partner violence:    Fear of current or ex partner: Not on file    Emotionally abused: Not on file    Physically abused: Not on file    Forced sexual activity: Not on file  Other Topics Concern  . Not on file  Social History Narrative  . Not on file   Social History   Social History Narrative  . Not on file     ROS: Negative.     PE: HEENT: Negative. Lungs: Clear. Cardio: RR.   Assessment/Plan:  Proceed with planned endoscopy.  Merrily Pew [facility] 04/26/2018

## 2018-04-26 NOTE — Anesthesia Preprocedure Evaluation (Signed)
Anesthesia Evaluation  Patient identified by MRN, date of birth, ID band Patient awake    Reviewed: Allergy & Precautions, H&P , NPO status , Patient's Chart, lab work & pertinent test results, reviewed documented beta blocker date and time   History of Anesthesia Complications Negative for: history of anesthetic complications  Airway Mallampati: I  TM Distance: >3 FB Neck ROM: full    Dental  (+) Upper Dentures, Dental Advidsory Given, Missing, Poor Dentition   Pulmonary neg pulmonary ROS, former smoker,           Cardiovascular Exercise Tolerance: Good hypertension, (-) angina(-) CAD, (-) Past MI, (-) Cardiac Stents and (-) CABG (-) dysrhythmias (-) Valvular Problems/Murmurs     Neuro/Psych PSYCHIATRIC DISORDERS Anxiety Depression negative neurological ROS     GI/Hepatic Neg liver ROS, GERD  ,  Endo/Other  negative endocrine ROS  Renal/GU negative Renal ROS  negative genitourinary   Musculoskeletal   Abdominal   Peds  Hematology negative hematology ROS (+)   Anesthesia Other Findings Past Medical History: No date: Anxiety No date: Arthritis 2010: Chronic back pain     Comment:  with pain pump No date: Depression No date: GERD (gastroesophageal reflux disease) No date: IBS (irritable bowel syndrome) 2018: Lichen sclerosus     Comment:  treated with methotrexate    Reproductive/Obstetrics negative OB ROS                             Anesthesia Physical Anesthesia Plan  ASA: II  Anesthesia Plan: General   Post-op Pain Management:    Induction: Intravenous  PONV Risk Score and Plan: 3 and Propofol infusion  Airway Management Planned: Nasal Cannula  Additional Equipment:   Intra-op Plan:   Post-operative Plan:   Informed Consent: I have reviewed the patients History and Physical, chart, labs and discussed the procedure including the risks, benefits and alternatives for  the proposed anesthesia with the patient or authorized representative who has indicated his/her understanding and acceptance.   Dental Advisory Given  Plan Discussed with: Anesthesiologist, CRNA and Surgeon  Anesthesia Plan Comments:         Anesthesia Quick Evaluation

## 2018-04-26 NOTE — Op Note (Signed)
[facility] Patient Name: Carol Orr Procedure Date: 04/26/2018 10:15 AM MRN: [id number] Account #: [id number] Date of Birth: 10/13/1956 Admit Type: Outpatient Age: 62 Room: [facility] ENDO ROOM 1 Gender: Female Note Status: Finalized Procedure:            Colonoscopy Indications:          Family history of colon cancer in a first-degree                        relative Providers:            Earline Mayotte, MD Referring MD:         Jillene Bucks. Arlana Pouch, MD (Referring MD) Medicines:            Monitored Anesthesia Care Complications:        No immediate complications. Procedure:            Pre-Anesthesia Assessment:                       - Prior to the procedure, a History and Physical was                        performed, and patient medications, allergies and                        sensitivities were reviewed. The patient's tolerance of                        previous anesthesia was reviewed.                       - The risks and benefits of the procedure and the                        sedation options and risks were discussed with the                        patient. All questions were answered and informed                        consent was obtained.                       After obtaining informed consent, the colonoscope was                        passed under direct vision. Throughout the procedure,                        the patient's blood pressure, pulse, and oxygen                        saturations were monitored continuously. The                        Colonoscope was introduced through the anus and                        advanced to the the cecum, identified by appendiceal  orifice and ileocecal valve. The colonoscopy was                        somewhat difficult due to significant looping.                        Successful completion of the procedure was aided by                        using manual pressure. The patient  tolerated the                        procedure well. The quality of the bowel preparation                        was good.                       It was not possible to get a clear photo of the                        appendiceal orifice as the photo option on the scope                        malfuntioned. Findings:      The entire examined colon appeared normal on direct and retroflexion       views. Impression:           - The entire examined colon is normal on direct and                        retroflexion views.                       - No specimens collected. Recommendation:       - Repeat colonoscopy in 5 years for surveillance. Procedure Code(s):    --- Professional ---                       [phone], Colonoscopy, flexible; diagnostic, including                        collection of specimen(s) by brushing or washing, when                        performed (separate procedure) Diagnosis Code(s):    --- Professional ---                       Z80.0, Family history of malignant neoplasm of                        digestive organs CPT copyright 2017 American Medical Association. All rights reserved. The codes documented in this report are preliminary and upon coder review may  be revised to meet current compliance requirements. Earline Mayotte, MD 04/26/2018 10:58:33 AM This report has been signed electronically. Number of Addenda: 0 Note Initiated On: 04/26/2018 10:15 AM Scope Withdrawal Time: 0 hours 8 minutes 26 seconds  Total Procedure Duration: 0 hours 30 minutes 4 seconds       [facility]

## 2018-04-26 NOTE — Anesthesia Procedure Notes (Signed)
Date/Time: 04/26/2018 10:16 AM Performed by: Stormy Fabian, CRNA Pre-anesthesia Checklist: Patient identified, Emergency Drugs available, Suction available and Patient being monitored Patient Re-evaluated:Patient Re-evaluated prior to induction Oxygen Delivery Method: Nasal cannula Induction Type: IV induction Dental Injury: Teeth and Oropharynx as per pre-operative assessment  Comments: Nasal cannula with etCO2 monitoring

## 2018-04-26 NOTE — Transfer of Care (Signed)
Immediate Anesthesia Transfer of Care Note  Patient: Carol Orr  Procedure(s) Performed: Procedure(s): COLONOSCOPY WITH PROPOFOL (N/A)  Patient Location: PACU and Endoscopy Unit  Anesthesia Type:General  Level of Consciousness: sedated  Airway & Oxygen Therapy: Patient Spontanous Breathing and Patient connected to nasal cannula oxygen  Post-op Assessment: Report given to RN and Post -op Vital signs reviewed and stable  Post vital signs: Reviewed and stable  Last Vitals:  Vitals:   04/26/18 0912 04/26/18 1100  BP: 136/68 (!) 93/47  Pulse: 67 67  Resp: 16 16  Temp: 36.5 C (!) 36.1 C  SpO2: 96% 100%    Complications: No apparent anesthesia complications

## 2018-04-26 NOTE — Anesthesia Post-op Follow-up Note (Signed)
Anesthesia QCDR form completed.        

## 2018-04-27 NOTE — Anesthesia Postprocedure Evaluation (Signed)
Anesthesia Post Note  Patient: Carol Orr  Procedure(s) Performed: COLONOSCOPY WITH PROPOFOL (N/A )  Patient location during evaluation: Endoscopy Anesthesia Type: General Level of consciousness: awake and alert Pain management: pain level controlled Vital Signs Assessment: post-procedure vital signs reviewed and stable Respiratory status: spontaneous breathing, nonlabored ventilation, respiratory function stable and patient connected to nasal cannula oxygen Cardiovascular status: blood pressure returned to baseline and stable Postop Assessment: no apparent nausea or vomiting Anesthetic complications: no     Last Vitals:  Vitals:   04/26/18 1110 04/26/18 1118  BP: 102/69 105/68  Pulse: 63 71  Resp: 16 16  Temp:    SpO2: 100% 99%    Last Pain:  Vitals:   04/27/18 0727  TempSrc:   PainSc: 0-No pain                 Lenard Simmer

## 2018-04-28 ENCOUNTER — Encounter: Payer: Self-pay | Admitting: General Surgery

## 2018-07-19 ENCOUNTER — Other Ambulatory Visit: Payer: Self-pay | Admitting: Internal Medicine

## 2018-07-19 DIAGNOSIS — Z1231 Encounter for screening mammogram for malignant neoplasm of breast: Secondary | ICD-10-CM

## 2018-08-22 ENCOUNTER — Ambulatory Visit
Admission: RE | Admit: 2018-08-22 | Discharge: 2018-08-22 | Disposition: A | Discharge status: Home / Self Care | Payer: Medicare Other | Source: Ambulatory Visit | Attending: Internal Medicine | Admitting: Internal Medicine

## 2018-08-22 DIAGNOSIS — Z1231 Encounter for screening mammogram for malignant neoplasm of breast: Secondary | ICD-10-CM | POA: Diagnosis not present

## 2021-03-04 ENCOUNTER — Other Ambulatory Visit: Payer: Self-pay | Admitting: Internal Medicine

## 2021-03-04 DIAGNOSIS — Z1231 Encounter for screening mammogram for malignant neoplasm of breast: Secondary | ICD-10-CM

## 2021-03-04 DIAGNOSIS — E2839 Other primary ovarian failure: Secondary | ICD-10-CM

## 2021-03-26 ENCOUNTER — Other Ambulatory Visit: Payer: Self-pay

## 2021-03-26 ENCOUNTER — Ambulatory Visit
Admission: RE | Admit: 2021-03-26 | Discharge: 2021-03-26 | Disposition: A | Discharge status: Home / Self Care | Payer: Medicare Other | Source: Ambulatory Visit | Attending: Internal Medicine | Admitting: Internal Medicine

## 2021-03-26 DIAGNOSIS — Z1231 Encounter for screening mammogram for malignant neoplasm of breast: Secondary | ICD-10-CM | POA: Insufficient documentation

## 2021-03-26 DIAGNOSIS — E2839 Other primary ovarian failure: Secondary | ICD-10-CM | POA: Diagnosis not present

## 2023-02-02 ENCOUNTER — Other Ambulatory Visit: Payer: Self-pay | Admitting: Internal Medicine

## 2023-02-02 DIAGNOSIS — Z1231 Encounter for screening mammogram for malignant neoplasm of breast: Secondary | ICD-10-CM

## 2023-02-23 ENCOUNTER — Ambulatory Visit
Admission: RE | Admit: 2023-02-23 | Discharge: 2023-02-23 | Disposition: A | Discharge status: Home / Self Care | Payer: Medicare Other | Source: Ambulatory Visit | Attending: Internal Medicine | Admitting: Internal Medicine

## 2023-02-23 DIAGNOSIS — Z1231 Encounter for screening mammogram for malignant neoplasm of breast: Secondary | ICD-10-CM | POA: Diagnosis not present

## 2023-11-29 ENCOUNTER — Ambulatory Visit (INDEPENDENT_AMBULATORY_CARE_PROVIDER_SITE_OTHER): Payer: Medicare Other | Admitting: Vascular Surgery

## 2023-11-29 VITALS — BP 132/79 | HR 72 | Resp 16 | Wt 206.8 lb

## 2023-11-29 DIAGNOSIS — I10 Essential (primary) hypertension: Secondary | ICD-10-CM

## 2023-11-29 DIAGNOSIS — I714 Abdominal aortic aneurysm, without rupture, unspecified: Secondary | ICD-10-CM | POA: Insufficient documentation

## 2023-11-29 DIAGNOSIS — E785 Hyperlipidemia, unspecified: Secondary | ICD-10-CM | POA: Diagnosis not present

## 2023-11-29 DIAGNOSIS — I7143 Infrarenal abdominal aortic aneurysm, without rupture: Secondary | ICD-10-CM | POA: Diagnosis not present

## 2023-11-29 DIAGNOSIS — E119 Type 2 diabetes mellitus without complications: Secondary | ICD-10-CM

## 2023-11-29 NOTE — Assessment & Plan Note (Signed)
lipid control important in reducing the progression of atherosclerotic disease. Continue statin therapy  

## 2023-11-29 NOTE — Progress Notes (Signed)
Patient ID: Carol Orr, female   DOB: [birth year], 67 y.o.   MRN: [id number]  Chief Complaint  Patient presents with   New Patient (Initial Visit)    Ref Arlana Pouch consult 3.55 cm AAA    HPI Carol Orr is a 67 y.o. female.  I am asked to see the patient by Dr. Arlana Pouch for evaluation of an AAA that was incidentally found on an MRI of her spine at an outside institution a couple of months ago.  This was reported as 3.55 cm. I can not see the images for review.  She is chronic low back pain and has a spine pump in place.  No new back pain, abdominal pain, or signs of peripheral embolization..     Past Medical History:  Diagnosis Date   Anxiety    Arthritis    Chronic back pain 2010   with pain pump   Depression    GERD (gastroesophageal reflux disease)    IBS (irritable bowel syndrome)    Lichen sclerosus 2018   treated with methotrexate     Past Surgical History:  Procedure Laterality Date   ABDOMINAL HYSTERECTOMY  2012   BACK SURGERY  1999   BILIARY DRAINAGE  2012   [facility]   CHOLECYSTECTOMY  2001   COLONOSCOPY  2007   COLONOSCOPY WITH PROPOFOL N/A 04/26/2018   Procedure: COLONOSCOPY WITH PROPOFOL;  Surgeon: Earline Mayotte, MD;  Location: [facility] ENDOSCOPY;  Service: Endoscopy;  Laterality: N/A;   ERCP     OOPHORECTOMY     SP BILIARY STONE EXTRACTION     ULNAR NERVE REPAIR Left 2014   Dr Hyacinth Meeker   UPPER GI ENDOSCOPY  2007     Family History  Problem Relation Age of Onset   Breast cancer Mother        >50   Colon cancer Mother    Ovarian cancer Sister    Breast cancer Sister        >50   Liver disease Father      Social History   Tobacco Use   Smoking status: Former    Current packs/day: 0.00    Types: Cigarettes    Quit date: 12/06/2013    Years since quitting: 9.9   Smokeless tobacco: Never  Substance Use Topics   Alcohol use: Never   Drug use: Never     Allergies  Allergen Reactions   Ciprofloxacin Rash   Penicillins Rash   Tape  Rash    Plastic tape causes a rash. Paper tape & opsite ok to use    Current Outpatient Medications  Medication Sig Dispense Refill   busPIRone (BUSPAR) 15 MG tablet TK 1/2 TO 1 T PO BID  5   Cholecalciferol (VITAMIN D3) 2000 units capsule Take 2,000 Units by mouth daily.      cyclobenzaprine (FLEXERIL) 5 MG tablet Take 5 mg by mouth 3 (three) times daily as needed.      empagliflozin (JARDIANCE) 10 MG TABS tablet Take 10 mg by mouth daily.     gabapentin (NEURONTIN) 800 MG tablet Take 800 mg by mouth 4 (four) times daily.      loratadine (CLARITIN) 10 MG tablet Take 10 mg by mouth daily.     metFORMIN (GLUCOPHAGE) 500 MG tablet Take 500 mg by mouth 2 (two) times daily with a meal.     metoprolol tartrate (LOPRESSOR) 25 MG tablet TAKE ONE TABLET BY MOUTH DAILY     montelukast (SINGULAIR) 10 MG  tablet Take 1 tablet (10 mg total) by mouth daily. 30 tablet 0   omeprazole (PRILOSEC) 20 MG capsule Take 20 mg by mouth 2 (two) times daily before a meal.     oxyCODONE-acetaminophen (PERCOCET) 10-325 MG tablet Take 1 tablet by mouth every 6 (six) hours as needed.   0   simvastatin (ZOCOR) 40 MG tablet Take 40 mg by mouth daily at 6 PM.      traMADol (ULTRAM) 50 MG tablet TK 1 T PO Q 6 HOURS PRN FOR PAIN  2   triamcinolone cream (KENALOG) 0.1 % APPLY TO THE AFFECTED AREA 2-3 TIMES A DAY AS NEEDED     UNABLE TO FIND Pain pump implant     venlafaxine XR (EFFEXOR-XR) 150 MG 24 hr capsule Take 150 mg by mouth daily.      clobetasol cream (TEMOVATE) 0.05 % Apply 1 application topically 2 (two) times daily. (Patient not taking: Reported on 11/29/2023)     estradiol (ESTRACE) 0.5 MG tablet Take 0.5 mg by mouth daily.  (Patient not taking: Reported on 11/29/2023)     ISOtretinoin (CLARAVIS) 40 MG capsule TK 1 C PO QD WITH FOOD (Patient not taking: Reported on 11/29/2023)     metoCLOPramide (REGLAN) 10 MG tablet Take 1 tablet (10 mg total) by mouth as directed for 2 doses. Take one tablet 30 minutes prior to  prep may repeat in 4 hours if needed (Patient not taking: Reported on 11/29/2023) 2 tablet 0   pimecrolimus (ELIDEL) 1 % cream Apply topically 2 (two) times daily. (Patient not taking: Reported on 11/29/2023)     No current facility-administered medications for this visit.      REVIEW OF SYSTEMS (Negative unless checked)  Constitutional: [] Weight loss  [] Fever  [] Chills Cardiac: [] Chest pain   [] Chest pressure   [] Palpitations   [] Shortness of breath when laying flat   [] Shortness of breath at rest   [] Shortness of breath with exertion. Vascular:  [] Pain in legs with walking   [] Pain in legs at rest   [] Pain in legs when laying flat   [] Claudication   [] Pain in feet when walking  [] Pain in feet at rest  [] Pain in feet when laying flat   [] History of DVT   [] Phlebitis   [] Swelling in legs   [] Varicose veins   [] Non-healing ulcers Pulmonary:   [] Uses home oxygen   [] Productive cough   [] Hemoptysis   [] Wheeze  [] COPD   [] Asthma Neurologic:  [] Dizziness  [] Blackouts   [] Seizures   [] History of stroke   [] History of TIA  [] Aphasia   [] Temporary blindness   [] Dysphagia   [] Weakness or numbness in arms   [] Weakness or numbness in legs Musculoskeletal:  [x] Arthritis   [] Joint swelling   [] Joint pain   [x] Low back pain Hematologic:  [] Easy bruising  [] Easy bleeding   [] Hypercoagulable state   [] Anemic  [] Hepatitis Gastrointestinal:  [] Blood in stool   [] Vomiting blood  [x] Gastroesophageal reflux/heartburn   [] Abdominal pain Genitourinary:  [] Chronic kidney disease   [] Difficult urination  [] Frequent urination  [] Burning with urination   [] Hematuria Skin:  [] Rashes   [] Ulcers   [] Wounds Psychological:  [x] History of anxiety   [x]  History of major depression.    Physical Exam BP 132/79   Pulse 72   Resp 16   Wt 206 lb 12.8 oz (93.8 kg)   BMI 32.39 kg/m  Gen:  WD/WN, NAD Head: [place]/AT, No temporalis wasting.  Ear/Nose/Throat: Hearing grossly intact, nares w/o erythema or drainage,  oropharynx w/o  Erythema/Exudate Eyes: Conjunctiva clear, sclera non-icteric  Neck: trachea midline.  No JVD.  Pulmonary:  Good air movement, respirations not labored, no use of accessory muscles  Cardiac: RRR, no JVD Vascular:  Vessel Right Left  Radial Palpable Palpable                                   Gastrointestinal:. No masses, surgical incisions, or scars. Aorta not palpable  Musculoskeletal: M/S 5/5 throughout.  Extremities without ischemic changes.  No deformity or atrophy. No edema. Neurologic: Sensation grossly intact in extremities.  Symmetrical.  Speech is fluent. Motor exam as listed above. Psychiatric: Judgment intact, Mood & affect appropriate for pt's clinical situation. Dermatologic: No rashes or ulcers noted.  No cellulitis or open wounds.    Radiology No results found.  Labs No results found for this or any previous visit (from the past 2160 hours).  Assessment/Plan:  AAA (abdominal aortic aneurysm) without rupture [facility]) The patient had an MRI of her spine which suggested a 3.55 cm distal aortic aneurysm.  Unfortunately, MRI is very inaccurate at measurement of the aneurysm and is difficult to discern what this means.  This was done at an outside institution about 3 months ago and I am unable to view the images only the report.  I have ordered a duplex of her abdominal aorta to be done in the near future at her convenience.  Essential hypertension blood pressure control important in reducing the progression of atherosclerotic disease and aneurysmal growth. On appropriate oral medications.   Diabetes (HCC) blood glucose control important in reducing the progression of atherosclerotic disease. Also, involved in wound healing. On appropriate medications.   Hyperlipidemia lipid control important in reducing the progression of atherosclerotic disease. Continue statin therapy      Festus Barren 11/29/2023, 11:31 AM   This note was created with Dragon medical  transcription system.  Any errors from dictation are unintentional.

## 2023-11-29 NOTE — Assessment & Plan Note (Signed)
blood pressure control important in reducing the progression of atherosclerotic disease and aneurysmal growth. On appropriate oral medications.  

## 2023-11-29 NOTE — Assessment & Plan Note (Signed)
The patient had an MRI of her spine which suggested a 3.55 cm distal aortic aneurysm.  Unfortunately, MRI is very inaccurate at measurement of the aneurysm and is difficult to discern what this means.  This was done at an outside institution about 3 months ago and I am unable to view the images only the report.  I have ordered a duplex of her abdominal aorta to be done in the near future at her convenience.

## 2023-11-29 NOTE — Patient Instructions (Signed)
Abdominal Aortic Aneurysm  An aneurysm is a bulge in an artery. An artery is a blood vessel that carries blood away from your heart. An abdominal aortic aneurysm happens in the main artery (aorta). Most aneurysms do not cause symptoms. But some can cause problems if they burst or tear. This can cause you to bleed inside your body. It is an emergency. It should be treated right away. What are the causes? The exact cause of an aneurysm is not known. What increases the risk? Being female and 60 years of age or older. Being Caucasian. Smoking. Being very overweight. Having: Family members who have had this condition. An injury to your main artery. Hardened arteries. Irritation and swelling of the walls of an artery. Certain genetic problems. An infection in the wall of the main artery. High cholesterol. High blood pressure. What are the signs or symptoms? You may not have any symptoms. If you do, you may have: Very bad pain in your side, back, or belly (abdomen). A feeling of fullness after you eat only a little bit of food. A lump in your belly that throbs. Problems with your feet or toes, like: Pain. Skin turning blue. Sores. Trouble pooping (constipation). Trouble peeing (urinating). If your aneurysm bursts, you may: Feel sudden, very bad pain in your belly, side, or back. Vomit or feel like you may vomit. Feel light-headed. Faint. How is this treated? Treatment depends on: Your age. How big your aneurysm is. How fast it is growing. How likely it is to burst. If the aneurysm is smaller than 2 inches (5 cm), your doctor may: Keep an eye on it to see if it gets bigger. You may need to have testing done every 6-12 months, every year, or every few years. Give you medicines for: High blood pressure. Pain. Infection. If the aneurysm is bigger than 2 inches (5 cm), you may need surgery. Follow these instructions at home: Eating and drinking  Eat foods that are good for your  heart. Eat a lot of: Fresh fruits and vegetables. Whole grains. Low-fat (lean) protein. Low-fat dairy products. Stay away from foods that are high in saturated fat and cholesterol. These include red meat and some dairy products. Lifestyle  Do not smoke or use any products that contain nicotine or tobacco. If you need help quitting, ask your doctor. Check your blood pressure often. Follow instructions from your doctor on how to keep it at a normal level. Have your cholesterol levels checked often. Follow instructions from your doctor on how to keep it at the right levels. Stay active. Get exercise. Ask your doctor how often to exercise. Ask what types of exercise are safe for you. Stay at a healthy weight. Alcohol use Do not drink alcohol if: Your doctor tells you not to drink. You are pregnant, may be pregnant, or plan to become pregnant. If you drink alcohol: Limit how much you have to: 0-1 drink a day if you are female. 0-2 drinks a day if you are female. Know how much alcohol is in your drink. In the [place] one drink is one 12 oz bottle of beer (355 mL), one 5 oz glass of wine (148 mL), or one 1 oz glass of hard liquor (44 mL). General instructions Take over-the-counter and prescription medicines only as told by your doctor. You may have to avoid lifting. Ask your doctor how much you can safely lift. If you can, learn your family's health history. Keep all follow-up visits. Your doctor will need   to keep an eye on your aneurysm and check how fast it is growing. Where to find more information American Heart Association: heart.org Contact a doctor if: Your belly, side, or back hurts. Your belly throbs. Your heart beats fast when you stand. You feel like you may vomit, or you vomit. You have trouble pooping. You have trouble peeing. You have a fever. Get help right away if: You have sudden, bad pain in your belly, side, or back. You feel light-headed, or you faint. You have  sweaty skin that is cold to the touch (clammy). You are short of breath. These symptoms may be an emergency. Get help right away. Call 911. Do not wait to see if the symptoms will go away. Do not drive yourself to the hospital. This information is not intended to replace advice given to you by your health care provider. Make sure you discuss any questions you have with your health care provider. Document Revised: 06/29/2022 Document Reviewed: 06/29/2022 Elsevier Patient Education  2024 [employer].

## 2023-11-29 NOTE — Assessment & Plan Note (Signed)
blood glucose control important in reducing the progression of atherosclerotic disease. Also, involved in wound healing. On appropriate medications.  

## 2023-12-22 ENCOUNTER — Other Ambulatory Visit: Payer: Self-pay

## 2024-01-06 ENCOUNTER — Telehealth (INDEPENDENT_AMBULATORY_CARE_PROVIDER_SITE_OTHER): Payer: Self-pay | Admitting: Vascular Surgery

## 2024-01-06 NOTE — Telephone Encounter (Signed)
Patient's husband called in to cancel her appointments for 2.6.25. He states that  she is currently in the hospital at [facility]. 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  He wanted to inform Dr. Wyn Quaker and stated that anyone could call with ay questions.

## 2024-01-12 ENCOUNTER — Ambulatory Visit (INDEPENDENT_AMBULATORY_CARE_PROVIDER_SITE_OTHER): Payer: Medicare Other | Admitting: Nurse Practitioner

## 2024-01-12 ENCOUNTER — Other Ambulatory Visit (INDEPENDENT_AMBULATORY_CARE_PROVIDER_SITE_OTHER): Payer: Medicare Other

## 2024-04-05 DEATH — deceased
